# Patient Record
Sex: Female | Born: 1955 | Race: White | Hispanic: No | Marital: Married | Smoking: Former smoker
Health system: Southern US, Community
[De-identification: ages and names within clinical notes are randomized; demographics above are authoritative.]

## PROBLEM LIST (undated history)

## (undated) DIAGNOSIS — R079 Chest pain, unspecified: Secondary | ICD-10-CM

## (undated) DIAGNOSIS — Z8249 Family history of ischemic heart disease and other diseases of the circulatory system: Secondary | ICD-10-CM

## (undated) DIAGNOSIS — I10 Essential (primary) hypertension: Secondary | ICD-10-CM

## (undated) DIAGNOSIS — E669 Obesity, unspecified: Secondary | ICD-10-CM

## (undated) DIAGNOSIS — E785 Hyperlipidemia, unspecified: Secondary | ICD-10-CM

## (undated) HISTORY — DX: Hyperlipidemia, unspecified: E78.5

## (undated) HISTORY — PX: WISDOM TOOTH EXTRACTION: SHX21

## (undated) HISTORY — PX: OTHER SURGICAL HISTORY: SHX169

## (undated) HISTORY — DX: Family history of ischemic heart disease and other diseases of the circulatory system: Z82.49

## (undated) HISTORY — DX: Obesity, unspecified: E66.9

## (undated) HISTORY — DX: Essential (primary) hypertension: I10

## (undated) HISTORY — DX: Chest pain, unspecified: R07.9

---

## 1999-09-30 ENCOUNTER — Ambulatory Visit (HOSPITAL_COMMUNITY): Admission: RE | Admit: 1999-09-30 | Discharge: 1999-09-30 | Payer: Self-pay | Admitting: Family Medicine

## 1999-09-30 ENCOUNTER — Encounter: Payer: Self-pay | Admitting: Family Medicine

## 2004-12-22 ENCOUNTER — Emergency Department (HOSPITAL_COMMUNITY): Admission: EM | Admit: 2004-12-22 | Discharge: 2004-12-23 | Payer: Self-pay | Admitting: Emergency Medicine

## 2011-12-04 ENCOUNTER — Ambulatory Visit (INDEPENDENT_AMBULATORY_CARE_PROVIDER_SITE_OTHER): Payer: Self-pay | Admitting: Obstetrics and Gynecology

## 2011-12-04 ENCOUNTER — Encounter: Payer: Self-pay | Admitting: Obstetrics and Gynecology

## 2011-12-04 VITALS — BP 118/78 | Ht 62.0 in | Wt 162.0 lb

## 2011-12-04 DIAGNOSIS — N951 Menopausal and female climacteric states: Secondary | ICD-10-CM

## 2011-12-04 LAB — T4, FREE: Free T4: 0.92 ng/dL (ref 0.80–1.80)

## 2011-12-04 LAB — ESTRADIOL: Estradiol: 19.2 pg/mL

## 2011-12-04 LAB — TSH: TSH: 1.785 u[IU]/mL (ref 0.350–4.500)

## 2011-12-04 NOTE — Patient Instructions (Signed)

## 2011-12-04 NOTE — Progress Notes (Signed)
Irreg Periods: no Mood Swings: yes Hot Flashes: yes Vaginal Dryness: yes Poor Sleeping: yes Urinary Urgency: no UTI Symptoms: no HRT: no Fam Hx Osteo: no Prior Bone Scan: No Osteoporosis: No Hx of Dvt: No  Pt here today to get hormone levels check pt post menopausal  Pt decline annual today only wants to discus menopausal symptoms Physical Examination: General appearance - alert, well appearing, and in no distress Mental status - alert, oriented to person, place, and time Neck - supple, no significant adenopathy Chest - clear to auscultation, no wheezes, rales or rhonchi, symmetric air entry, no tachypnea, retractions or cyanosis Heart - normal rate and regular rhythm Abdomen - soft, nontender, nondistended, no masses or organomegaly Breasts - breasts appear normal, no suspicious masses, no skin or nipple changes or axillary nodes, patient declines to have breast exam Pelvic - normal external genitalia, vulva, vagina, cervix, uterus and adnexa, exam declined by the patient Rectal - normal rectal, no masses, declined by the patient Back exam - full range of motion, no tenderness, palpable spasm or pain on motion Musculoskeletal - no joint tenderness, deformity or swelling Extremities - peripheral pulses normal, no pedal edema, no clubbing or cyanosis Skin - normal coloration and turgor, no rashes, no suspicious skin lesions noted Menopausal symptoms Pt declined pap and pelvic exam Check testosterone, estrogen and progesterone. Will determine if she needs HRT and F/U with EP Pt will do guiac cards She declined colonoscopy

## 2011-12-07 ENCOUNTER — Telehealth: Payer: Self-pay | Admitting: Obstetrics and Gynecology

## 2011-12-07 DIAGNOSIS — N951 Menopausal and female climacteric states: Secondary | ICD-10-CM | POA: Insufficient documentation

## 2011-12-07 LAB — TESTOSTERONE, FREE, TOTAL, SHBG: Sex Hormone Binding: 38 nmol/L (ref 18–114)

## 2011-12-07 MED ORDER — ESTRADIOL 0.0375 MG/24HR TD PTTW: 1.0000 | MEDICATED_PATCH | TRANSDERMAL | Status: DC

## 2011-12-07 MED ORDER — PROGESTERONE MICRONIZED 200 MG PO CAPS: 200.0000 mg | ORAL_CAPSULE | Freq: Every day | ORAL | Status: DC

## 2011-12-07 NOTE — Telephone Encounter (Signed)
Call to patient at Dr. Redmond Baseman request to review hormone labs and management.  Patient became menopausal just over a year ago and developed moodiness, brittle hair, decreased energy, occasional depression, insomnia and decreased libido.  She admits to personal challenges in that her 57 YO daughter was diagnosed and treated for cancer that required a hysterectomy.  Patient's testosterone and TSH were normal and estradiol low normal and progesterone undetectable.  Patient states that she wants to try some hormones  Adds that her daughter takes progesterone drops.  Reviewed causes of decreased libido in women to include her recent family crisis.  To place patient on Vivelle-Dot (Minivelle) 0.0375 mg Patch twice weekly and Micronized Progesterone 200mg  qhs.   Patient to follow up in 4-6 weeks with a report of how her symptoms are and the possible need to adjust her medication.  Urged to call sooner if needed.  Savayah Waltrip J. Lowell Guitar, PA-C

## 2011-12-10 ENCOUNTER — Telehealth: Payer: Self-pay | Admitting: Obstetrics and Gynecology

## 2011-12-11 NOTE — Telephone Encounter (Signed)
Tc to pt per telephone call. Pt states,"took patch off due to feeling of bp being increased". Pt states," started to have severe ha's and felt like the top of her head was going to come off". Pt wants to know of any additional rec for meds. Will consult with ep and cb with recs. Pt voices understanding.

## 2011-12-22 ENCOUNTER — Telehealth: Payer: Self-pay | Admitting: Obstetrics and Gynecology

## 2011-12-22 NOTE — Telephone Encounter (Signed)
Call to patient who left message about having an increase in Bp  3 hours after starting Vivelle-Dot Patch 0.0375.  Left message that  a return call will be made.  Henreitta Leber, PA-C

## 2011-12-22 NOTE — Telephone Encounter (Signed)
56 YO returns call stating that she had a headache with increased Bp when she used the [employer] so she stopped it.  Only used the Progesterone for 3 days and stopped it because she wasn't sure whether to use it.  Advised to resume the progesterone and to call in 2 weeks to  give up date on results.  Patient was agreeable.  Henreitta Leber, PA-C

## 2014-04-25 ENCOUNTER — Ambulatory Visit: Payer: BC Managed Care – PPO | Admitting: Obstetrics & Gynecology

## 2014-05-21 ENCOUNTER — Ambulatory Visit (INDEPENDENT_AMBULATORY_CARE_PROVIDER_SITE_OTHER): Payer: BC Managed Care – PPO | Admitting: Obstetrics

## 2014-05-21 ENCOUNTER — Encounter: Payer: Self-pay | Admitting: Obstetrics & Gynecology

## 2014-05-21 ENCOUNTER — Ambulatory Visit: Payer: BC Managed Care – PPO | Admitting: Obstetrics & Gynecology

## 2014-05-21 VITALS — BP 151/92 | HR 66 | Temp 98.5°F | Wt 173.0 lb

## 2014-05-21 DIAGNOSIS — N842 Polyp of vagina: Secondary | ICD-10-CM | POA: Insufficient documentation

## 2014-05-21 DIAGNOSIS — N898 Other specified noninflammatory disorders of vagina: Secondary | ICD-10-CM

## 2014-05-21 LAB — WET PREP BY MOLECULAR PROBE
CANDIDA SPECIES: NEGATIVE
GARDNERELLA VAGINALIS: NEGATIVE
Trichomonas vaginosis: NEGATIVE

## 2014-05-21 NOTE — Progress Notes (Signed)
Patient ID: [name], female   DOB: 05-04-1956, 58 y.o.   MRN: [id number]  Chief Complaint  Patient presents with  . Problem    HPI Christy Wong is a 58 y.o. female.  Referred by PCP for vaginal polyp.  No complaints.  Menopausal.  HPI  History reviewed. No pertinent past medical history.  Past Surgical History  Procedure Laterality Date  . Tummy tuck    . Wisdom tooth extraction      Family History  Problem Relation Age of Onset  . Heart disease Father   . Uterine cancer Daughter   . Hypertension Mother     Social History History  Substance Use Topics  . Smoking status: Never Smoker   . Smokeless tobacco: Not on file  . Alcohol Use: No    No Known Allergies  Current Outpatient Prescriptions  Medication Sig Dispense Refill  . estradiol (MINIVELLE) 0.0375 MG/24HR Place 1 patch onto the skin 2 (two) times a week.  8 patch  12  . Multiple Vitamins-Minerals (MULTIVITAMIN WITH MINERALS) tablet Take 1 tablet by mouth daily.      . progesterone (PROMETRIUM) 200 MG capsule Take 1 capsule (200 mg total) by mouth daily. bedtime  30 capsule  11   No current facility-administered medications for this visit.    Review of Systems Review of Systems Constitutional: negative for fatigue and weight loss Respiratory: negative for cough and wheezing Cardiovascular: negative for chest pain, fatigue and palpitations Gastrointestinal: negative for abdominal pain and change in bowel habits Genitourinary: vaginal polyp Integument/breast: negative for nipple discharge Musculoskeletal:negative for myalgias Neurological: negative for gait problems and tremors Behavioral/Psych: negative for abusive relationship, depression Endocrine: negative for temperature intolerance     Blood pressure 151/92, pulse 66, temperature 98.5 F (36.9 C), weight 173 lb (78.472 kg).  Physical Exam Physical Exam General:   alert  Skin:   no rash or abnormalities  Lungs:   clear to auscultation  bilaterally  Heart:   regular rate and rhythm, S1, S2 normal, no murmur, click, rub or Lipke  Breasts:   normal without suspicious masses, skin or nipple changes or axillary nodes  Abdomen:  normal findings: no organomegaly, soft, non-tender and no hernia  Pelvis:  External genitalia: normal general appearance Urinary system: urethral meatus normal and bladder without fullness, nontender Vaginal: small healthy appearing polyp ~ 1cm left vaginal wall. Cervix: normal appearance Adnexa: normal bimanual exam Uterus: anteverted and non-tender, normal size      Data Reviewed labs  Assessment    Vaginal polyp.  Benign appearing. Menopausal, no complaints.     Plan   Discussed findings on exam.  Patient would not like any intervention if poly appears benign.  Will follow.  Mammogram recommended.  Orders Placed This Encounter  Procedures  . WET PREP BY MOLECULAR PROBE   No orders of the defined types were placed in this encounter.        HARPER,CHARLES A 05/21/2014, 6:04 PM

## 2014-06-04 ENCOUNTER — Encounter: Payer: Self-pay | Admitting: Obstetrics & Gynecology

## 2014-06-21 ENCOUNTER — Telehealth: Payer: Self-pay | Admitting: Cardiovascular Disease

## 2014-06-21 NOTE — Telephone Encounter (Signed)
Received records from [place] ( Dr Precious Haws) for appointment with Dr Gwenlyn Found on 07/04/14.  Records given to [facility] (medical records) for Dr Kennon Holter schedule on 07/04/14.  lp

## 2014-07-04 ENCOUNTER — Encounter: Payer: Self-pay | Admitting: Cardiovascular Disease

## 2014-07-04 ENCOUNTER — Ambulatory Visit (INDEPENDENT_AMBULATORY_CARE_PROVIDER_SITE_OTHER): Payer: BC Managed Care – PPO | Admitting: Cardiovascular Disease

## 2014-07-04 VITALS — BP 114/82 | HR 91 | Ht 62.0 in | Wt 173.0 lb

## 2014-07-04 DIAGNOSIS — E785 Hyperlipidemia, unspecified: Secondary | ICD-10-CM | POA: Insufficient documentation

## 2014-07-04 DIAGNOSIS — R079 Chest pain, unspecified: Secondary | ICD-10-CM | POA: Insufficient documentation

## 2014-07-04 DIAGNOSIS — I10 Essential (primary) hypertension: Secondary | ICD-10-CM

## 2014-07-04 DIAGNOSIS — I209 Angina pectoris, unspecified: Secondary | ICD-10-CM

## 2014-07-04 NOTE — Assessment & Plan Note (Signed)
Patient had a recent episode of left jaw and upper extremity pain with some fullness in her chest. She had minor episodes prior to that. Her risk factors include premature family history of heart disease, hypertension and hyperlipidemia. She really does not exercise. I'm going to get a pharmacologic Myoview stress test to rule out an ischemic etiology.

## 2014-07-04 NOTE — Assessment & Plan Note (Signed)
Recent onset hypertension with blood pressure today measured at 114/82 on Benicar 20 mg a day which was recently started. Continue current medication at current dose

## 2014-07-04 NOTE — Progress Notes (Signed)
07/04/2014 Christy Wong   1955/12/18  [id number]  Primary Physician Christy Bill, MD Primary Cardiologist: Christy Harp MD Christy Wong   HPI:  Christy Wong is a 58 year old moderately overweight married Caucasian female mother of 2, grandmother of one grandchild referred by Christy Wong in [place] for evaluation of coronary disease. Her cardiac risk factor profile is notable for premature family history of heart disease with a father who had his first myocardial infarction at age 35 and died 106 years later. She has a history of treated hypertension and untreated hyperlipidemia. She works as a Forensic psychologist. 2 weeks ago she had an episode of left jaw pain and left upper extremity pain and some chest fullness. She had some subtle symptoms prior to that And none since.   Current Outpatient Prescriptions  Medication Sig Dispense Refill  . acyclovir (ZOVIRAX) 400 MG tablet Take by mouth as needed. Prn as needed for fever blisters    . aspirin 81 MG tablet Take 81 mg by mouth 2 (two) times a week.    . Bromelains (BROMELAIN PO) Take 1 tablet by mouth daily.    Marland Kitchen dexlansoprazole (DEXILANT) 60 MG capsule Take 60 mg by mouth as needed.    . fluticasone (FLONASE) 50 MCG/ACT nasal spray as needed.    . Multiple Vitamins-Minerals (MULTIVITAMIN WITH MINERALS) tablet Take 1 tablet by mouth daily.    Marland Kitchen olmesartan (BENICAR) 20 MG tablet Take 20 mg by mouth daily.    . Omega-3 Fatty Acids (FISH OIL CONCENTRATE PO) Take by mouth.     No current facility-administered medications for this visit.    No Known Allergies  History   Social History  . Marital Status: Married    Spouse Name: N/A    Number of Children: N/A  . Years of Education: N/A   Occupational History  . Not on file.   Social History Main Topics  . Smoking status: Former Research scientist (life sciences)  . Smokeless tobacco: Not on file     Comment: smoked for a short period in the 70s and also used the nicotine vapor  in 2014  . Alcohol Use: No  . Drug Use: Not on file  . Sexual Activity: Yes    Birth Control/ Protection: Post-menopausal   Other Topics Concern  . Not on file   Social History Narrative     Review of Systems: General: negative for chills, fever, night sweats or weight changes.  Cardiovascular: negative for chest pain, dyspnea on exertion, edema, orthopnea, palpitations, paroxysmal nocturnal dyspnea or shortness of breath Dermatological: negative for rash Respiratory: negative for cough or wheezing Urologic: negative for hematuria Abdominal: negative for nausea, vomiting, diarrhea, bright red blood per rectum, melena, or hematemesis Neurologic: negative for visual changes, syncope, or dizziness All other systems reviewed and are otherwise negative except as noted above.    Blood pressure 114/82, pulse 91, height 5\' 2"  (1.575 m), weight 173 lb (78.472 kg).  General appearance: alert and no distress Neck: no adenopathy, no carotid bruit, no JVD, supple, symmetrical, trachea midline and thyroid not enlarged, symmetric, no tenderness/mass/nodules Lungs: clear to auscultation bilaterally Heart: regular rate and rhythm, S1, S2 normal, no murmur, click, rub or Teicher Extremities: extremities normal, atraumatic, no cyanosis or edema  EKG normal sinus rhythm at 91 with nonspecific ST and T-wave changes. I personally reviewed this EKG  ASSESSMENT AND PLAN:   Essential hypertension Recent onset hypertension with blood pressure today measured at 114/82 on  Benicar 20 mg a day which was recently started. Continue current medication at current dose  Chest pain Patient had a recent episode of left jaw and upper extremity pain with some fullness in her chest. She had minor episodes prior to that. Her risk factors include premature family history of heart disease, hypertension and hyperlipidemia. She really does not exercise. I'm going to get a pharmacologic Myoview stress test to rule out an  ischemic etiology.  Hyperlipidemia History of hyperlipidemia currently not on statin drugs. We will obtain her most recent blood work and evaluate      Christy Harp MD [place], [facility] 07/04/2014 12:28 PM

## 2014-07-04 NOTE — Patient Instructions (Signed)
Lexiscan Myoview- this is a test that looks at the blood flow to your heart muscle.  It takes approximately 2 1/2 hours. Please follow instruction sheet, as given.   Dr. Gwenlyn Found would like you to follow up with him following your diagnostic exam.

## 2014-07-04 NOTE — Assessment & Plan Note (Signed)
History of hyperlipidemia currently not on statin drugs. We will obtain her most recent blood work and evaluate

## 2014-07-06 ENCOUNTER — Telehealth (HOSPITAL_COMMUNITY): Payer: Self-pay

## 2014-07-06 NOTE — Telephone Encounter (Signed)
Encounter complete. 

## 2014-07-11 ENCOUNTER — Ambulatory Visit (HOSPITAL_COMMUNITY)
Admission: RE | Admit: 2014-07-11 | Discharge: 2014-07-11 | Disposition: A | Discharge status: Home / Self Care | Payer: BC Managed Care – PPO | Source: Ambulatory Visit | Attending: Cardiovascular Disease | Admitting: Cardiovascular Disease

## 2014-07-11 DIAGNOSIS — I209 Angina pectoris, unspecified: Secondary | ICD-10-CM | POA: Diagnosis present

## 2014-07-11 MED ORDER — AMINOPHYLLINE 25 MG/ML IV SOLN
75.0000 mg | Freq: Once | INTRAVENOUS | Status: AC
  Administered 2014-07-11: 75 mg via INTRAVENOUS

## 2014-07-11 MED ORDER — REGADENOSON 0.4 MG/5ML IV SOLN
0.4000 mg | Freq: Once | INTRAVENOUS | Status: AC
  Administered 2014-07-11: 0.4 mg via INTRAVENOUS

## 2014-07-11 MED ORDER — TECHNETIUM TC 99M SESTAMIBI GENERIC - CARDIOLITE
10.8000 | Freq: Once | INTRAVENOUS | Status: AC | PRN
  Administered 2014-07-11: 11 via INTRAVENOUS

## 2014-07-11 MED ORDER — TECHNETIUM TC 99M SESTAMIBI GENERIC - CARDIOLITE
29.2000 | Freq: Once | INTRAVENOUS | Status: AC | PRN
  Administered 2014-07-11: 29.2 via INTRAVENOUS

## 2014-07-11 NOTE — Procedures (Addendum)
[place] [address] [place] [address] [phone]  Cardiology Nuclear Med Study  Christy Wong is a 58 y.o. female     MRN : [id number]     DOB: 16-Nov-1955  Procedure Date: 07/11/2014  Nuclear Med Background Indication for Stress Test:  Evaluation for Ischemia History:  No prior cardiac or respiratory history reported. No prior NUC MPI for comparison. Cardiac Risk Factors: Family History - CAD, History of Smoking, Hypertension, Lipids and Obesity  Symptoms:  Chest Pain, DOE, Fatigue, SOB and Jaw pain and upper extremity pain.   Nuclear Pre-Procedure Caffeine/Decaff Intake:  1:00am NPO After: 11am   IV Site: R Forearm  IV 0.9% NS with Angio Cath:  22g  Chest Size (in):  n/a IV Started by: Rolene Course, RN  Height: 5\' 2"  (1.575 m)  Cup Size: D  BMI:  Body mass index is 31.63 kg/(m^2). Weight:  173 lb (78.472 kg)   Tech Comments:  n/a    Nuclear Med Study 1 or 2 day study: 1 day  Stress Test Type:  [place] Provider:  Quay Burow, MD   Resting Radionuclide: Technetium 49m Sestamibi  Resting Radionuclide Dose: 10.8 mCi   Stress Radionuclide:  Technetium 29m Sestamibi  Stress Radionuclide Dose: 29.2 mCi           Stress Protocol Rest HR: 71 Stress HR: 136  Rest BP: 108/74 Stress BP:138/85  Exercise Time (min): n/a METS: n/a          Dose of Adenosine (mg):  n/a Dose of Lexiscan: 0.4 mg  Dose of Atropine (mg): n/a Dose of Dobutamine: n/a mcg/kg/min (at max HR)  Stress Test Technologist: Mellody Memos, CCT Nuclear Technologist: Imagene Riches, CNMT   Rest Procedure:  Myocardial perfusion imaging was performed at rest 45 minutes following the intravenous administration of Technetium 85m Sestamibi. Stress Procedure:  The patient received IV Lexiscan 0.4 mg over 15-seconds.  Technetium 88m Sestamibi injected at 30-seconds.  Patient experienced shortness of breath, dizziness, chest  tightness and a tingling sensation all over.  She was administered 75 mg of Aminophylline IV . There were no significant changes with Lexiscan.  Quantitative spect images were obtained after a 45 minute delay.  Transient Ischemic Dilatation (Normal <1.22):  1.26  QGS EDV:  57 ml QGS ESV:  18 ml LV Ejection Fraction: 68%   Rest ECG: NSR - Normal EKG  Stress ECG: No significant change from baseline ECG  QPS Raw Data Images:  Normal; no motion artifact; normal heart/lung ratio. Stress Images:  Normal homogeneous uptake in all areas of the myocardium. Rest Images:  Normal homogeneous uptake in all areas of the myocardium. Subtraction (SDS):  No evidence of ischemia. LV Wall Motion:  NL LV Function; NL Wall Motion  Impression Exercise Capacity:  Lexiscan with no exercise. BP Response:  Normal blood pressure response. Clinical Symptoms:  No significant symptoms noted. ECG Impression:  No significant ECG changes with Lexiscan. Comparison with Prior Nuclear Study: No previous nuclear study performed   Overall Impression:  Normal stress nuclear study.   Sanda Klein, MD  07/11/2014 5:01 PM

## 2014-07-13 ENCOUNTER — Encounter: Payer: Self-pay | Admitting: Cardiovascular Disease

## 2014-07-30 ENCOUNTER — Encounter: Payer: Self-pay | Admitting: *Deleted

## 2014-07-31 ENCOUNTER — Encounter: Payer: Self-pay | Admitting: Obstetrics & Gynecology

## 2014-07-31 ENCOUNTER — Encounter: Payer: Self-pay | Admitting: Cardiovascular Disease

## 2014-07-31 ENCOUNTER — Ambulatory Visit (INDEPENDENT_AMBULATORY_CARE_PROVIDER_SITE_OTHER): Payer: BC Managed Care – PPO | Admitting: Cardiovascular Disease

## 2014-07-31 VITALS — BP 118/80 | HR 66 | Ht 62.0 in | Wt 175.8 lb

## 2014-07-31 DIAGNOSIS — I10 Essential (primary) hypertension: Secondary | ICD-10-CM

## 2014-07-31 DIAGNOSIS — E785 Hyperlipidemia, unspecified: Secondary | ICD-10-CM

## 2014-07-31 DIAGNOSIS — I209 Angina pectoris, unspecified: Secondary | ICD-10-CM

## 2014-07-31 NOTE — Patient Instructions (Signed)
Your physician wants you to follow-up in 6 months with Dr. Gwenlyn Found. You will receive a reminder letter in the mail 2 months in advance. If you do not receive a letter, please call our office to schedule the follow-up appointment.  *NO CHARGE FOR OFFICE VISIT-PER DR. Gwenlyn Found*

## 2014-07-31 NOTE — Assessment & Plan Note (Signed)
Blood pressure today was 118/80 on Benicar 20 mg a day. Continue current meds at current dosing

## 2014-07-31 NOTE — Progress Notes (Signed)
Mrs. Christy Wong returns for follow-up of her Myoview stress test performed on 07/11/14 to evaluate jaw or arm and chest pain. This was entirely normal. She has had some atypical left upper extremity pain which does not sound ischemic. Her blood pressure is under better control and her hyperlipidemia is being treated by her PCP. I will see her back in 6 months for follow-up.  Lorretta Harp, M.D., [place], [facility], [place] [address] [place], [place]  [address]  [phone] 07/31/2014 9:08 AM

## 2014-07-31 NOTE — Assessment & Plan Note (Signed)
No further jaw or chest pain. The patient had a negative Myoview stress test on 07/11/14. I reassured her and we'll see her back in 6 months

## 2014-07-31 NOTE — Assessment & Plan Note (Signed)
History of hyperlipidemia on red yeast rice followed by her PCP

## 2014-12-07 ENCOUNTER — Other Ambulatory Visit: Payer: Self-pay | Admitting: Family Medicine

## 2015-07-01 ENCOUNTER — Other Ambulatory Visit (HOSPITAL_COMMUNITY)
Admission: RE | Admit: 2015-07-01 | Discharge: 2015-07-01 | Disposition: A | Discharge status: Home / Self Care | Payer: 59 | Source: Ambulatory Visit | Attending: Obstetrics and Gynecology | Admitting: Obstetrics and Gynecology

## 2015-07-01 ENCOUNTER — Other Ambulatory Visit: Payer: Self-pay | Admitting: Obstetrics and Gynecology

## 2015-07-01 DIAGNOSIS — Z01419 Encounter for gynecological examination (general) (routine) without abnormal findings: Secondary | ICD-10-CM | POA: Insufficient documentation

## 2015-07-01 DIAGNOSIS — Z1151 Encounter for screening for human papillomavirus (HPV): Secondary | ICD-10-CM | POA: Diagnosis not present

## 2015-07-02 LAB — CYTOLOGY - PAP

## 2015-07-05 ENCOUNTER — Other Ambulatory Visit: Payer: Self-pay | Admitting: Obstetrics and Gynecology

## 2015-07-25 ENCOUNTER — Encounter (HOSPITAL_COMMUNITY)
Admission: RE | Admit: 2015-07-25 | Discharge: 2015-07-25 | Disposition: A | Discharge status: Home / Self Care | Payer: 59 | Source: Ambulatory Visit | Attending: Obstetrics and Gynecology | Admitting: Obstetrics and Gynecology

## 2015-07-25 ENCOUNTER — Other Ambulatory Visit: Payer: Self-pay

## 2015-07-25 ENCOUNTER — Encounter (HOSPITAL_COMMUNITY): Payer: Self-pay

## 2015-07-25 DIAGNOSIS — Z01818 Encounter for other preprocedural examination: Secondary | ICD-10-CM | POA: Diagnosis not present

## 2015-07-25 DIAGNOSIS — N950 Postmenopausal bleeding: Secondary | ICD-10-CM | POA: Insufficient documentation

## 2015-07-25 LAB — CBC
HCT: 40.5 % (ref 36.0–46.0)
Hemoglobin: 13.1 g/dL (ref 12.0–15.0)
MCH: 29.5 pg (ref 26.0–34.0)
MCHC: 32.3 g/dL (ref 30.0–36.0)
MCV: 91.2 fL (ref 78.0–100.0)
Platelets: 337 10*3/uL (ref 150–400)
RBC: 4.44 MIL/uL (ref 3.87–5.11)
RDW: 14 % (ref 11.5–15.5)
WBC: 8.4 10*3/uL (ref 4.0–10.5)

## 2015-07-25 LAB — BASIC METABOLIC PANEL
Anion gap: 7 (ref 5–15)
BUN: 13 mg/dL (ref 6–20)
CO2: 27 mmol/L (ref 22–32)
Calcium: 9.3 mg/dL (ref 8.9–10.3)
Chloride: 100 mmol/L — ABNORMAL LOW (ref 101–111)
Creatinine, Ser: 0.7 mg/dL (ref 0.44–1.00)
GFR calc Af Amer: >60 mL/min (ref >60)
GFR calc non Af Amer: >60 mL/min (ref >60)
GLUCOSE: 120 mg/dL — AB (ref 65–99)
POTASSIUM: 4.1 mmol/L (ref 3.5–5.1)
Sodium: 134 mmol/L — ABNORMAL LOW (ref 135–145)

## 2015-07-25 NOTE — Patient Instructions (Addendum)
Your procedure is scheduled on:07/30/15  Enter through the Main Entrance at :9am  Pick up desk phone and dial [phone] and inform us of your arrival.  Please call [phone] if you have any problems the morning of surgery.  Remember: Do not eat food or drink liquids, including water, after midnight:Monday   You may brush your teeth the morning of surgery.  Take these meds the morning of surgery with a sip of water: Benicar  DO NOT wear jewelry, eye make-up, lipstick,body lotion, or dark fingernail polish.  (Polished toes are ok) You may wear deodorant.  If you are to be admitted after surgery, leave suitcase in car until your room has been assigned. Patients discharged on the day of surgery will not be allowed to drive home. Wear loose fitting, comfortable clothes for your ride home.

## 2015-07-26 NOTE — Anesthesia Preprocedure Evaluation (Addendum)
Anesthesia Evaluation  Patient identified by MRN, date of birth, ID band Patient awake    Reviewed: Allergy & Precautions, NPO status , Patient's Chart, lab work & pertinent test results  Airway Mallampati: II   Neck ROM: Full    Dental  (+) Teeth Intact, Dental Advisory Given   Pulmonary neg pulmonary ROS, former smoker (quit 1998),    breath sounds clear to auscultation       Cardiovascular hypertension, Pt. on medications  Rhythm:Regular  STRESS 07/2014 Normal EF 68%   Neuro/Psych negative neurological ROS  negative psych ROS   GI/Hepatic negative GI ROS, Neg liver ROS,   Endo/Other  negative endocrine ROS  Renal/GU negative Renal ROS   Post menopausal bleeding   negative genitourinary   Musculoskeletal negative musculoskeletal ROS (+)   Abdominal (+)  Abdomen: soft.    Peds negative pediatric ROS (+)  Hematology negative hematology ROS (+) 13/40   Anesthesia Other Findings   Reproductive/Obstetrics negative OB ROS                            Anesthesia Physical Anesthesia Plan  ASA: II  Anesthesia Plan: General   Post-op Pain Management:    Induction:   Airway Management Planned: LMA  Additional Equipment:   Intra-op Plan:   Post-operative Plan: Extubation in OR  Informed Consent: I have reviewed the patients History and Physical, chart, labs and discussed the procedure including the risks, benefits and alternatives for the proposed anesthesia with the patient or authorized representative who has indicated his/her understanding and acceptance.     Plan Discussed with:   Anesthesia Plan Comments:         Anesthesia Quick Evaluation
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
## 2015-07-29 NOTE — H&P (Signed)
History of Present Illness  General:  Pt presents for EMB due to PMB. Failed attempt due to cervical stenosis. Pt s/p Cytotec. Pt has an endometrial mass vs. submucosal fibroid on ultrasound. Mass measures ~1.5 cm without blood flow in the fundus. Hysteroscopy/D&C with resection of mass if EMB is negative for carcinoma. Pt agreeable.   Current Medications  Taking   Vitamin B12 3000 MCG/ML Liquid   Benicar(Olmesartan Medoxomil) 20 MG Tablet   Acyclovir 400 mg tablet 1 tab as needed four times a day   Cytotec(Misoprostol) 200 MCG Tablet 1 table Plce 1 tablet in vagina in the morning the day before procedure and then place 1 tablet night before procedure   Not-Taking/PRN   Flonase(Fluticasone Propionate) 50 MCG/ACT Suspension 1 spray in each nostril Once a day   Unknown   Zithromax Z-pak(Azithromycin) 250 MG Tablet 2 p.o. the 1st day, then 1 p.o. q.d. for 4 days    Past Medical History  Fever blisters  Vaginal polyp  Bunion, right foot  Bacteria vaginitis  Hyperlipidemia  Pterygium of left eye  Tobacco use  Allergic rhinitis   Surgical History  tummy tuck early 82s   Family History  Father: deceased 71 yrs, CAD, died of MI at 34, first MI at 27, h/o agent orange exposure  Mother: deceased 48 yrs, fibromalgia, back prolems, DM, HTN, diagnosed with Lung Ca  Paternal Grand Father: deceased  Paternal [place] Mother: deceased  Maternal Grand Father: deceased  Maternal Grand Mother: deceased  Brother 1: alive 26 yrs, healthy  1 brother(s) .   Denies family hx colon cancer, colon polyps or liver disease, Daughter had endometrial stroma carcinoma at 68.   Social History  General:  Tobacco use  cigarettes: Former smoker Quit in year 1980 Tobacco history last updated 07/05/2015 no EXPOSURE TO PASSIVE SMOKE.  Alcohol: yes, occ.  no Recreational drug use.  Exercise: minimal.  Marital Status: Married Doctor, hospital, Theme park manager at [employer].  Children: 2 daughters Olivia Mackie and  Erasmo Downer.  OCCUPATION: day trading and rental properties.  Religion: [facility].    Gyn History  Periods : Postmenopasaul at age 60 .  LMP spotting off and on x 6 months.  Denies H/O Birth control.  Last pap smear date 07/01/15, all negative.  Last mammogram date more than two years.  Denies H/O Abnormal pap smear.  Denies H/O STD.    OB History  Number of pregnancies 2.  Pregnancy # 1 live birth, vaginal delivery.  Pregnancy # 2 live birth, vaginal delivery.    Allergies  N.K.D.A.   Hospitalization/Major Diagnostic Procedure  tummy tuck    Vital Signs  Wt 178, Ht 61.75, BMI 32.82, Pulse sitting 54, BP sitting 120/74.   Physical Examination  GENERAL:  Patient appears alert and oriented.  General Appearance: well-appearing, well-developed, no acute distress.  Speech: clear.  FEMALE GENITOURINARY:  Cervix visualized, healthy appearing, no discharge, no lesions.  Vagina: atrophic mucosa, no lesions, no abnormal discharge.  Vulva: normal, no lesions, no skin discoloration.     Assessments   1. Postmenopausal bleeding - N95.0 (Primary)   2. Endometrial mass - N94.89   Treatment  1. Postmenopausal bleeding  Notes: Pt counseled on Hyst/D&C with resection of mass via Myosure to complete w/u of PMB. Refer to Gyn/Onc if carcinoma.    Procedures  Endometrial Biopsy:  Consent Informed consent obtained. UPT negative.  Prep cervix was prepped with betadine.  Procedure uterus was sounded to 5-6 cm. a 4 mm  pipet was advanced without difficulty, with good return of tissue, 2 passes.  Post procedure Patient tolerated procedure well.  Indication abnormal uterine bleeding.       Procedure Codes  [id number] ENDOMET BIOPSY OFFICE   Follow Up  prn pending results

## 2015-07-30 ENCOUNTER — Ambulatory Visit (HOSPITAL_COMMUNITY)
Admission: RE | Admit: 2015-07-30 | Discharge: 2015-07-30 | Disposition: A | Discharge status: Home / Self Care | Payer: 59 | Source: Ambulatory Visit | Attending: Obstetrics and Gynecology | Admitting: Obstetrics and Gynecology

## 2015-07-30 ENCOUNTER — Ambulatory Visit (HOSPITAL_COMMUNITY): Payer: 59 | Admitting: Anesthesiology

## 2015-07-30 ENCOUNTER — Encounter (HOSPITAL_COMMUNITY)
Admission: RE | Disposition: A | Discharge status: Home / Self Care | Payer: Self-pay | Source: Ambulatory Visit | Attending: Obstetrics and Gynecology

## 2015-07-30 ENCOUNTER — Encounter (HOSPITAL_COMMUNITY): Payer: Self-pay | Admitting: *Deleted

## 2015-07-30 DIAGNOSIS — Z87891 Personal history of nicotine dependence: Secondary | ICD-10-CM | POA: Insufficient documentation

## 2015-07-30 DIAGNOSIS — N950 Postmenopausal bleeding: Secondary | ICD-10-CM | POA: Diagnosis present

## 2015-07-30 DIAGNOSIS — M4802 Spinal stenosis, cervical region: Secondary | ICD-10-CM | POA: Diagnosis not present

## 2015-07-30 DIAGNOSIS — D250 Submucous leiomyoma of uterus: Secondary | ICD-10-CM | POA: Insufficient documentation

## 2015-07-30 DIAGNOSIS — N840 Polyp of corpus uteri: Secondary | ICD-10-CM | POA: Diagnosis not present

## 2015-07-30 HISTORY — PX: DILATATION & CURETTAGE/HYSTEROSCOPY WITH MYOSURE: SHX6511

## 2015-07-30 SURGERY — DILATATION & CURETTAGE/HYSTEROSCOPY WITH MYOSURE
Anesthesia: General | Site: Vagina

## 2015-07-30 MED ORDER — LIDOCAINE HCL (CARDIAC) 20 MG/ML IV SOLN
INTRAVENOUS | Status: AC
  Filled 2015-07-30: qty 5

## 2015-07-30 MED ORDER — LACTATED RINGERS IV SOLN
INTRAVENOUS | Status: DC
  Administered 2015-07-30: 11:00:00 via INTRAVENOUS
  Administered 2015-07-30: 75 mL/h via INTRAVENOUS

## 2015-07-30 MED ORDER — SODIUM CHLORIDE 0.9 % IR SOLN
Status: DC | PRN
  Administered 2015-07-30 (×2): 1000 mL
  Administered 2015-07-30: 2000 mL

## 2015-07-30 MED ORDER — PROMETHAZINE HCL 25 MG/ML IJ SOLN
6.2500 mg | INTRAMUSCULAR | Status: DC | PRN
Start: 2015-07-30 — End: 2015-07-30

## 2015-07-30 MED ORDER — FENTANYL CITRATE (PF) 100 MCG/2ML IJ SOLN
INTRAMUSCULAR | Status: AC
  Filled 2015-07-30: qty 2

## 2015-07-30 MED ORDER — DEXAMETHASONE SODIUM PHOSPHATE 10 MG/ML IJ SOLN
INTRAMUSCULAR | Status: AC
  Filled 2015-07-30: qty 1

## 2015-07-30 MED ORDER — ONDANSETRON HCL 4 MG/2ML IJ SOLN
INTRAMUSCULAR | Status: AC
  Filled 2015-07-30: qty 2

## 2015-07-30 MED ORDER — KETOROLAC TROMETHAMINE 30 MG/ML IJ SOLN
INTRAMUSCULAR | Status: DC | PRN
  Administered 2015-07-30: 30 mg via INTRAVENOUS

## 2015-07-30 MED ORDER — PROPOFOL 10 MG/ML IV BOLUS
INTRAVENOUS | Status: AC
  Filled 2015-07-30: qty 20

## 2015-07-30 MED ORDER — KETOROLAC TROMETHAMINE 30 MG/ML IJ SOLN
INTRAMUSCULAR | Status: AC
  Filled 2015-07-30: qty 1

## 2015-07-30 MED ORDER — MEPERIDINE HCL 25 MG/ML IJ SOLN: 6.2500 mg | INTRAMUSCULAR | Status: DC | PRN

## 2015-07-30 MED ORDER — MIDAZOLAM HCL 5 MG/5ML IJ SOLN
INTRAMUSCULAR | Status: DC | PRN
  Administered 2015-07-30: 2 mg via INTRAVENOUS

## 2015-07-30 MED ORDER — FENTANYL CITRATE (PF) 100 MCG/2ML IJ SOLN
INTRAMUSCULAR | Status: DC | PRN
  Administered 2015-07-30 (×2): 50 ug via INTRAVENOUS

## 2015-07-30 MED ORDER — MIDAZOLAM HCL 2 MG/2ML IJ SOLN
INTRAMUSCULAR | Status: AC
  Filled 2015-07-30: qty 2

## 2015-07-30 MED ORDER — FENTANYL CITRATE (PF) 100 MCG/2ML IJ SOLN: 25.0000 ug | INTRAMUSCULAR | Status: DC | PRN

## 2015-07-30 MED ORDER — LIDOCAINE HCL 2 % IJ SOLN
INTRAMUSCULAR | Status: AC
  Filled 2015-07-30: qty 20

## 2015-07-30 MED ORDER — LIDOCAINE HCL 2 % IJ SOLN
INTRAMUSCULAR | Status: DC | PRN
  Administered 2015-07-30: 10 mL

## 2015-07-30 MED ORDER — EPHEDRINE 5 MG/ML INJ
INTRAVENOUS | Status: AC
Start: 2015-07-30 — End: 2015-07-30
  Filled 2015-07-30: qty 10

## 2015-07-30 MED ORDER — EPHEDRINE SULFATE 50 MG/ML IJ SOLN
INTRAMUSCULAR | Status: DC | PRN
  Administered 2015-07-30 (×4): 5 mg via INTRAVENOUS

## 2015-07-30 MED ORDER — DEXAMETHASONE SODIUM PHOSPHATE 4 MG/ML IJ SOLN
INTRAMUSCULAR | Status: DC | PRN
  Administered 2015-07-30: 10 mg via INTRAVENOUS

## 2015-07-30 MED ORDER — LIDOCAINE HCL (CARDIAC) 20 MG/ML IV SOLN
INTRAVENOUS | Status: DC | PRN
  Administered 2015-07-30: 100 mg via INTRAVENOUS

## 2015-07-30 MED ORDER — OXYCODONE-ACETAMINOPHEN 5-325 MG PO TABS: 2.0000 | ORAL_TABLET | ORAL | Status: DC | PRN

## 2015-07-30 MED ORDER — PROPOFOL 10 MG/ML IV BOLUS
INTRAVENOUS | Status: DC | PRN
  Administered 2015-07-30: 200 mg via INTRAVENOUS

## 2015-07-30 MED ORDER — ONDANSETRON HCL 4 MG/2ML IJ SOLN
INTRAMUSCULAR | Status: DC | PRN
  Administered 2015-07-30: 4 mg via INTRAVENOUS

## 2015-07-30 MED ORDER — IBUPROFEN 600 MG PO TABS: 600.0000 mg | ORAL_TABLET | Freq: Four times a day (QID) | ORAL | Status: AC | PRN

## 2015-07-30 SURGICAL SUPPLY — 19 items
CANISTER SUCT 3000ML (MISCELLANEOUS) ×5 IMPLANT
CATH ROBINSON RED A/P 16FR (CATHETERS) ×3 IMPLANT
CLOTH BEACON ORANGE TIMEOUT ST (SAFETY) ×3 IMPLANT
CONTAINER PREFILL 10% NBF 60ML (FORM) ×6 IMPLANT
DEVICE MYOSURE LITE (MISCELLANEOUS) IMPLANT
DEVICE MYOSURE REACH (MISCELLANEOUS) ×2 IMPLANT
DILATOR CANAL MILEX (MISCELLANEOUS) IMPLANT
FILTER ARTHROSCOPY CONVERTOR (FILTER) ×3 IMPLANT
GLOVE BIO SURGEON STRL SZ7 (GLOVE) ×3 IMPLANT
GLOVE BIOGEL PI IND STRL 7.0 (GLOVE) ×2 IMPLANT
GLOVE BIOGEL PI INDICATOR 7.0 (GLOVE) ×4
GOWN STRL REUS W/TWL LRG LVL3 (GOWN DISPOSABLE) ×6 IMPLANT
PACK VAGINAL MINOR WOMEN LF (CUSTOM PROCEDURE TRAY) ×3 IMPLANT
PAD OB MATERNITY 4.3X12.25 (PERSONAL CARE ITEMS) ×3 IMPLANT
SEAL ROD LENS SCOPE MYOSURE (ABLATOR) ×3 IMPLANT
TOWEL OR 17X24 6PK STRL BLUE (TOWEL DISPOSABLE) ×6 IMPLANT
TUBING AQUILEX INFLOW (TUBING) ×3 IMPLANT
TUBING AQUILEX OUTFLOW (TUBING) ×3 IMPLANT
WATER STERILE IRR 1000ML POUR (IV SOLUTION) ×3 IMPLANT

## 2015-07-30 NOTE — Discharge Instructions (Addendum)
Dilation and Curettage or Vacuum Curettage, Care After Refer to this sheet in the next few weeks. These instructions provide you with information on caring for yourself after your procedure. Your health care provider may also give you more specific instructions. Your treatment has been planned according to current medical practices, but problems sometimes occur. Call your health care provider if you have any problems or questions after your procedure. WHAT TO EXPECT AFTER THE PROCEDURE After your procedure, it is typical to have light cramping and bleeding. This may last for 2 days to 2 weeks after the procedure. HOME CARE INSTRUCTIONS   Do not drive for 24 hours.  Wait 1 week before returning to strenuous activities.  Take your temperature 2 times a day for 4 days and write it down. Provide these temperatures to your health care provider if you develop a fever.  Avoid long periods of standing.  Avoid heavy lifting, pushing, or pulling. Do not lift anything heavier than 10 pounds (4.5 kg).  Limit stair climbing to once or twice a day.  Take rest periods often.  You may resume your usual diet.  Drink enough fluids to keep your urine clear or pale yellow.  Your usual bowel function should return. If you have constipation, you may:  Take a mild laxative with permission from your health care provider.  Add fruit and bran to your diet.  Drink more fluids.  Take showers instead of baths until your health care provider gives you permission to take baths.  Do not go swimming or use a hot tub until your health care provider approves.  Try to have someone with you or available to you the first 24-48 hours, especially if you were given a general anesthetic.  Do not douche, use tampons, or have sex (intercourse) for 2 weeks after the procedure.  Only take over-the-counter or prescription medicines as directed by your health care provider. Do not take aspirin. It can cause  bleeding.  Follow up with your health care provider as directed. SEEK MEDICAL CARE IF:   You have increasing cramps or pain that is not relieved with medicine.  You have abdominal pain that does not seem to be related to the same area of earlier cramping and pain.  You have bad smelling vaginal discharge.  You have a rash.  You are having problems with any medicine. SEEK IMMEDIATE MEDICAL CARE IF:   You have bleeding that is heavier than a normal menstrual period.  You have a fever.  You have chest pain.  You have shortness of breath.  You feel dizzy or feel like fainting.  You pass out.  You have pain in your shoulder strap area.  You have heavy vaginal bleeding with or without blood clots. MAKE SURE YOU:   Understand these instructions.  Will watch your condition.  Will get help right away if you are not doing well or get worse.   This information is not intended to replace advice given to you by your health care provider. Make sure you discuss any questions you have with your health care provider.   Document Released: 07/17/2000 Document Revised: 07/25/2013 Document Reviewed: 02/16/2013 Elsevier Interactive Patient Education 2016 [place]: D&C / D&E The following instructions have been prepared to help you care for yourself upon your return home.   Personal hygiene:  Use sanitary pads for vaginal drainage, not tampons.  Shower the day after your procedure.  NO tub baths, pools or Jacuzzis for 2-3 weeks.  Wipe front to back after using the bathroom.  Activity and limitations:  Do NOT drive or operate any equipment for 24 hours. The effects of anesthesia are still present and drowsiness may result.  Do NOT rest in bed all day.  Walking is encouraged.  Walk up and down stairs slowly.  You may resume your normal activity in one to two days or as indicated by your physician.  Sexual activity: NO intercourse for at least 2  weeks after the procedure, or as indicated by your physician.  Diet: Eat a light meal as desired this evening. You may resume your usual diet tomorrow.  Return to work: You may resume your work activities in one to two days or as indicated by your doctor.  What to expect after your surgery: Expect to have vaginal bleeding/discharge for 2-3 days and spotting for up to 10 days. It is not unusual to have soreness for up to 1-2 weeks. You may have a slight burning sensation when you urinate for the first day. Mild cramps may continue for a couple of days. You may have a regular period in 2-6 weeks.  Call your doctor for any of the following:  Excessive vaginal bleeding, saturating and changing one pad every hour.  Inability to urinate 6 hours after discharge from hospital.  Pain not relieved by pain medication.  Fever of 100.4 F or greater.  Unusual vaginal discharge or odor.   Call for an appointment:    Patients signature: ______________________  Nurses signature ________________________  Support person's signature_______________________

## 2015-07-30 NOTE — Interval H&P Note (Signed)
History and Physical Interval Note:  07/30/2015 10:18 AM  Christy Wong  has presented today for surgery, with the diagnosis of  POST MENOPAUSAL BLEEDING  The various methods of treatment have been discussed with the patient and family. After consideration of risks, benefits and other options for treatment, the patient has consented to  Procedure(s) with comments: [place] (N/A) - Myosure for possible fibroid. as a surgical intervention .  The patient's history has been reviewed, patient examined, no change in status, stable for surgery.  I have reviewed the patient's chart and labs.  Questions were answered to the patient's satisfaction.     Simona Huh, Adalae Baysinger

## 2015-07-30 NOTE — Brief Op Note (Signed)
07/30/2015  11:29 AM  PATIENT:  Lezlie Octave Lona  59 y.o. female  PRE-OPERATIVE DIAGNOSIS:   POST MENOPAUSAL BLEEDING, endometrial mass  POST-OPERATIVE DIAGNOSIS:   Endometrial polyps, submucosal fibroid  PROCEDURE:  Procedure(s): DILATATION & CURETTAGE/HYSTEROSCOPY WITH MYOSURE (N/A)  Resection of fibroids and polyps  SURGEON:  Surgeon(s) and Role:    * Thurnell Lose, MD - Primary  PHYSICIAN ASSISTANT:   ASSISTANTS: none   ANESTHESIA:   general, paracervical block  EBL:  Total I/O In: 1000 [I.V.:1000] Out: 250 [Urine:250]  BLOOD ADMINISTERED:none  DRAINS: none   LOCAL MEDICATIONS USED:  2 % LIDOCAINE  and Amount: 10 ml  SPECIMEN:  Source of Specimen:  fibroids, polyps, endometrial currettings  DISPOSITION OF SPECIMEN:  PATHOLOGY  COUNTS:  YES  TOURNIQUET:  * No tourniquets in log *  DICTATION: .Other Dictation: Dictation Number [id number]  PLAN OF CARE: Discharge to home after PACU  PATIENT DISPOSITION:  PACU - hemodynamically stable.   Delay start of Pharmacological VTE agent (>24hrs) due to surgical blood loss or risk of bleeding: not applicable

## 2015-07-30 NOTE — Transfer of Care (Signed)
Immediate Anesthesia Transfer of Care Note  Patient: Christy Wong  Procedure(s) Performed: Procedure(s): DILATATION & CURETTAGE/HYSTEROSCOPY WITH MYOSURE (N/A)  Patient Location: PACU  Anesthesia Type:General  Level of Consciousness: awake, alert  and oriented  Airway & Oxygen Therapy: Patient Spontanous Breathing and Patient connected to nasal cannula oxygen  Post-op Assessment: Report given to RN and Post -op Vital signs reviewed and stable  Post vital signs: Reviewed and stable  Last Vitals:  Filed Vitals:   07/30/15 0918  BP: 127/86  Temp: 36.8 C  Resp: 16    Complications: No apparent anesthesia complications

## 2015-07-30 NOTE — Anesthesia Postprocedure Evaluation (Signed)
Anesthesia Post Note  Patient: Christy Wong  Procedure(s) Performed: Procedure(s) (LRB): DILATATION & CURETTAGE/HYSTEROSCOPY WITH MYOSURE (N/A)  Patient location during evaluation: PACU Anesthesia Type: General Level of consciousness: awake and alert Pain management: pain level controlled Vital Signs Assessment: post-procedure vital signs reviewed and stable Respiratory status: spontaneous breathing, nonlabored ventilation, respiratory function stable and patient connected to nasal cannula oxygen Cardiovascular status: blood pressure returned to baseline and stable Postop Assessment: no signs of nausea or vomiting Anesthetic complications: no    Last Vitals:  Filed Vitals:   07/30/15 1115 07/30/15 1130  BP: 128/82   Pulse: 101 98  Temp: 36.7 C   Resp: 12 17    Last Pain: There were no vitals filed for this visit.               Sharnee Douglass

## 2015-07-30 NOTE — Op Note (Signed)
Christy Wong, Christy Wong                ACCOUNT NO.:  [id number]  MEDICAL RECORD NO.:  [id number]  LOCATION:  [facility]                          FACILITY:  [facility]  PHYSICIAN:  Jola Schmidt, MD   DATE OF BIRTH:  1955-08-05  DATE OF PROCEDURE:  07/30/2015 DATE OF DISCHARGE:                              OPERATIVE REPORT   PREOPERATIVE DIAGNOSES: 1. Postmenopausal bleeding. 2. Endometrial mass.  POSTOPERATIVE DIAGNOSES: 1. Endometrial polyps. 2. Submucosal fibroid.  PROCEDURE:  Hysteroscopy, D and C, with resection of fibroids and polyps via MyoSure.  SURGEON:  Raul Del. Simona Huh, M.D.  ASSISTANT:  None.  ANESTHESIA:  General and paracervical block.  URINE OUTPUT:  250 mL out at the beginning of case.  EBL:  Minimal.  BLOOD ADMINISTERED:  None.  DRAINS:  None.  LOCAL:  2% lidocaine of 10 mL.  SPECIMEN:  Fibroid, polyp, and endometrial curettings.  Disposition of specimen to Pathology.  DISPOSITION:  To PACU.  Hemodynamically stable.  COMPLICATIONS:  None.  FINDINGS:  Three endometrial polyps and one submucosal fibroid.  Once the masses were resected, the endometrial cavity appeared to be normal.  PROCEDURE IN DETAIL:  [name] was taken to the operating room.  She was identified in the holding area.  She was consented for the procedure.  Risks and benefits were reviewed.  All questions are answered.  She was then taken to the operating room with IV running. She underwent general anesthesia without complication.  She was placed in the dorsal lithotomy position, prepped and draped.  A time-out was performed.  SCDs were on and operating prior to the procedure.  The patient did not received IV antibiotics.  The speculum was placed in the vagina.  Cervix was easily visualized.  A 2-3 mL of lidocaine was injected at the anterior lip of the cervix and the tenaculum was applied.  Paracervical block was performed.  The cervix was dilated up to a 6 dilator.  The hysteroscope was  then advanced to through the endocervix and one of the long endometrial polyps was hanging down into the cervix.  I was able to move around the polyps to see the fundus, but there is definitely a fibroid noted.  The MyoSure was then used to remove the polyps and at that point, I could see the fibroid very easily and that was easily resected as well without complication.  Endometrial cavity appeared normal and without any concerns for perforation.  The hysteroscope was removed, I did a sharp curettage of all 4 quadrants and the endometrial curettings were collected on the Telfa.  A single- tooth tenaculum was removed from the cervix.  There was no bleeding. Then, the speculum was removed from the vagina.  The patient did have some stooling at the last part of the procedure that did not interfere with any vaginal portion of the procedure.  All instruments, sponge and needle counts were correct x3.  The patient tolerated the procedure well.  She was taken to the recovery room in stable condition.  All fluid deficit from the hysteroscope was approximately 500.     Jola Schmidt, MD     EBV/MEDQ  D:  07/30/2015  T:  07/30/2015  Job:  [id number]

## 2015-07-30 NOTE — Anesthesia Procedure Notes (Signed)
Procedure Name: LMA Insertion Date/Time: 07/30/2015 10:33 AM Performed by: Riki Sheer Pre-anesthesia Checklist: Patient identified, Emergency Drugs available, Suction available, Patient being monitored and Timeout performed Patient Re-evaluated:Patient Re-evaluated prior to inductionOxygen Delivery Method: Circle system utilized Preoxygenation: Pre-oxygenation with 100% oxygen Intubation Type: IV induction Ventilation: Mask ventilation without difficulty LMA: LMA inserted LMA Size: 4.0 Number of attempts: 1 Placement Confirmation: positive ETCO2,  CO2 detector and breath sounds checked- equal and bilateral Tube secured with: Tape Dental Injury: Teeth and Oropharynx as per pre-operative assessment

## 2015-07-31 ENCOUNTER — Encounter (HOSPITAL_COMMUNITY): Payer: Self-pay | Admitting: Obstetrics and Gynecology

## 2020-10-02 ENCOUNTER — Emergency Department (HOSPITAL_BASED_OUTPATIENT_CLINIC_OR_DEPARTMENT_OTHER): Payer: Medicare Other

## 2020-10-02 ENCOUNTER — Other Ambulatory Visit: Payer: Self-pay

## 2020-10-02 ENCOUNTER — Emergency Department (HOSPITAL_BASED_OUTPATIENT_CLINIC_OR_DEPARTMENT_OTHER)
Admission: EM | Admit: 2020-10-02 | Discharge: 2020-10-02 | Disposition: A | Discharge status: Home / Self Care | Payer: Medicare Other | Attending: Emergency Medicine | Admitting: Emergency Medicine

## 2020-10-02 ENCOUNTER — Encounter (HOSPITAL_BASED_OUTPATIENT_CLINIC_OR_DEPARTMENT_OTHER): Payer: Self-pay | Admitting: Emergency Medicine

## 2020-10-02 DIAGNOSIS — Z87891 Personal history of nicotine dependence: Secondary | ICD-10-CM | POA: Insufficient documentation

## 2020-10-02 DIAGNOSIS — R109 Unspecified abdominal pain: Secondary | ICD-10-CM | POA: Diagnosis present

## 2020-10-02 DIAGNOSIS — I10 Essential (primary) hypertension: Secondary | ICD-10-CM | POA: Diagnosis not present

## 2020-10-02 DIAGNOSIS — K5792 Diverticulitis of intestine, part unspecified, without perforation or abscess without bleeding: Secondary | ICD-10-CM | POA: Insufficient documentation

## 2020-10-02 LAB — COMPREHENSIVE METABOLIC PANEL
ALT: 20 U/L (ref 0–44)
AST: 21 U/L (ref 15–41)
Albumin: 4.5 g/dL (ref 3.5–5.0)
Alkaline Phosphatase: 59 U/L (ref 38–126)
Anion gap: 12 (ref 5–15)
BUN: 21 mg/dL (ref 8–23)
CO2: 26 mmol/L (ref 22–32)
Calcium: 9.5 mg/dL (ref 8.9–10.3)
Chloride: 97 mmol/L — ABNORMAL LOW (ref 98–111)
Creatinine, Ser: 0.74 mg/dL (ref 0.44–1.00)
GFR, Estimated: >60 mL/min (ref >60)
Glucose, Bld: 109 mg/dL — ABNORMAL HIGH (ref 70–99)
Potassium: 3.7 mmol/L (ref 3.5–5.1)
Sodium: 135 mmol/L (ref 135–145)
Total Bilirubin: 0.4 mg/dL (ref 0.3–1.2)
Total Protein: 8.2 g/dL — ABNORMAL HIGH (ref 6.5–8.1)

## 2020-10-02 LAB — URINALYSIS, MICROSCOPIC (REFLEX): Bacteria, UA: NONE SEEN

## 2020-10-02 LAB — CBC
HCT: 40.7 % (ref 36.0–46.0)
Hemoglobin: 13.4 g/dL (ref 12.0–15.0)
MCH: 29.8 pg (ref 26.0–34.0)
MCHC: 32.9 g/dL (ref 30.0–36.0)
MCV: 90.4 fL (ref 80.0–100.0)
Platelets: 383 10*3/uL (ref 150–400)
RBC: 4.5 MIL/uL (ref 3.87–5.11)
RDW: 13.2 % (ref 11.5–15.5)
WBC: 14.8 10*3/uL — ABNORMAL HIGH (ref 4.0–10.5)
nRBC: 0 % (ref 0.0–0.2)

## 2020-10-02 LAB — LIPASE, BLOOD: Lipase: 36 U/L (ref 11–51)

## 2020-10-02 LAB — URINALYSIS, ROUTINE W REFLEX MICROSCOPIC
Bilirubin Urine: NEGATIVE
Glucose, UA: NEGATIVE mg/dL
Ketones, ur: 15 mg/dL — AB
Leukocytes,Ua: NEGATIVE
Nitrite: NEGATIVE
Protein, ur: NEGATIVE mg/dL
Specific Gravity, Urine: 1.025 (ref 1.005–1.030)
pH: 5 (ref 5.0–8.0)

## 2020-10-02 MED ORDER — IOHEXOL 300 MG/ML  SOLN
100.0000 mL | Freq: Once | INTRAMUSCULAR | Status: AC | PRN
  Administered 2020-10-02: 100 mL via INTRAVENOUS

## 2020-10-02 MED ORDER — ONDANSETRON HCL 4 MG/2ML IJ SOLN
4.0000 mg | Freq: Once | INTRAMUSCULAR | Status: AC
  Administered 2020-10-02: 4 mg via INTRAVENOUS
  Filled 2020-10-02: qty 2

## 2020-10-02 MED ORDER — MORPHINE SULFATE (PF) 2 MG/ML IV SOLN
2.0000 mg | Freq: Once | INTRAVENOUS | Status: AC
  Administered 2020-10-02: 2 mg via INTRAVENOUS
  Filled 2020-10-02: qty 1

## 2020-10-02 MED ORDER — AMOXICILLIN-POT CLAVULANATE 875-125 MG PO TABS
1.0000 | ORAL_TABLET | Freq: Once | ORAL | Status: AC
  Administered 2020-10-02: 1 via ORAL
  Filled 2020-10-02: qty 1

## 2020-10-02 MED ORDER — SODIUM CHLORIDE 0.9 % IV BOLUS
500.0000 mL | Freq: Once | INTRAVENOUS | Status: AC
  Administered 2020-10-02: 500 mL via INTRAVENOUS

## 2020-10-02 MED ORDER — OXYCODONE-ACETAMINOPHEN 5-325 MG PO TABS: 1.0000 | ORAL_TABLET | Freq: Four times a day (QID) | ORAL | 0 refills | Status: AC | PRN

## 2020-10-02 MED ORDER — AMOXICILLIN-POT CLAVULANATE 875-125 MG PO TABS: 1.0000 | ORAL_TABLET | Freq: Two times a day (BID) | ORAL | 0 refills | Status: AC

## 2020-10-02 MED ORDER — ONDANSETRON 4 MG PO TBDP: 4.0000 mg | ORAL_TABLET | Freq: Three times a day (TID) | ORAL | 0 refills | Status: AC | PRN

## 2020-10-02 NOTE — ED Triage Notes (Signed)
C/o lower abd pain x 2 days. Pt states that the pain comes in waves and then she has diarrhea. Pt c/o N/V even with eating BRAT diet. Hx of diverticulitis. Called GI and due to symptoms told to come to ER. Pt aaox3, ambualtory with steady gait, VSS, GCS 15, NAD noted.

## 2020-10-02 NOTE — Discharge Instructions (Signed)
We believe your symptoms are caused by diverticulitis.  Most of the time this condition (please read through the included information) can be cured with outpatient antibiotics.  Please take the full course of prescribed medication(s) and follow up with the doctors recommended above.  Return to the ED if your abdominal pain worsens or fails to improve, you develop bloody vomiting, bloody diarrhea, you are unable to tolerate fluids due to vomiting, fever greater than 101, or other symptoms that concern you.  Take Percocet as prescribed for severe pain. Do not drink alcohol, drive or participate in any other potentially dangerous activities while taking this medication as it may make you sleepy. Do not take this medication with any other sedating medications, either prescription or over-the-counter. If you were prescribed Percocet or Vicodin, do not take these with acetaminophen (Tylenol) as it is already contained within these medications.   This medication is an opiate (or narcotic) pain medication and can be habit forming.  Use it as little as possible to achieve adequate pain control.  Do not use or use it with extreme caution if you have a history of opiate abuse or dependence.  This medication is intended for your use only - do not give any to anyone else and keep it in a secure place where nobody else, especially children, have access to it.  It will also cause or worsen constipation, so you may want to consider taking an over-the-counter stool softener while you are taking this medication.

## 2020-10-10 NOTE — ED Provider Notes (Signed)
Emergency Department Provider Note   I have reviewed the triage vital signs and the nursing notes.   HISTORY  Chief Complaint Abdominal Pain   HPI Christy Wong is a 65 y.o. female presents to the ED with lower abdominal pain with diarrhea. She notes a clinical diagnosis last year of diverticulitis which improved with abx. She has similar pain now. She has tried OTC meds and diet change at home with no relief. She denies any fever/chills. Notes some associated nausea and vomiting without blood. Pain is moderate and mainly in the LLQ. No radiation of symptoms.   Past Medical History:  Diagnosis Date  . Chest pain   . Family history of heart disease   . HTN (hypertension)   . Hyperlipidemia   . Obesity   . Vaginal delivery 1979, 1982    Patient Active Problem List   Diagnosis Date Noted  . Chest pain 07/04/2014  . Essential hypertension 07/04/2014  . Hyperlipidemia 07/04/2014  . Polyp, vagina 05/21/2014  . Menopausal symptoms 12/07/2011    Past Surgical History:  Procedure Laterality Date  . DILATATION & CURETTAGE/HYSTEROSCOPY WITH MYOSURE N/A 07/30/2015   Procedure: DILATATION & CURETTAGE/HYSTEROSCOPY WITH MYOSURE;  Surgeon: Thurnell Lose, MD;  Location: [facility];  Service: Gynecology;  Laterality: N/A;  . tummy tuck    . WISDOM TOOTH EXTRACTION      Allergies Bee venom  Family History  Problem Relation Age of Onset  . Heart disease Father   . Uterine cancer Daughter   . Hypertension Mother     Social History Social History   Tobacco Use  . Smoking status: Former Smoker    Types: Cigarettes  . Tobacco comment: smoked for a short period in the 70s and also used the nicotine vapor in 2014  Substance Use Topics  . Alcohol use: Yes    Alcohol/week: 0.0 standard drinks    Comment: occasionally  . Drug use: No    Review of Systems  Constitutional: No fever/chills Eyes: No visual changes. ENT: No sore throat. Cardiovascular: Denies chest  pain. Respiratory: Denies shortness of breath. Gastrointestinal: Positive LLQ abdominal pain. Positive nausea and vomiting.  No diarrhea.  No constipation. Genitourinary: Negative for dysuria. Musculoskeletal: Negative for back pain. Skin: Negative for rash. Neurological: Negative for headaches, focal weakness or numbness.  10-point ROS otherwise negative.  ____________________________________________   PHYSICAL EXAM:  VITAL SIGNS: ED Triage Vitals  Enc Vitals Group     BP 10/02/20 1928 (!) 142/90     Pulse Rate 10/02/20 1928 90     Resp 10/02/20 1928 19     Temp 10/02/20 1928 100 F (37.8 C)     Temp Source 10/02/20 1928 Oral     SpO2 10/02/20 1928 98 %     Weight 10/02/20 1930 155 lb (70.3 kg)     Height 10/02/20 1930 5\' 2"  (1.575 m)   Constitutional: Alert and oriented. Well appearing and in no acute distress. Eyes: Conjunctivae are normal.  Head: Atraumatic. Nose: No congestion/rhinnorhea. Mouth/Throat: Mucous membranes are moist. Cardiovascular: Normal rate, regular rhythm. Good peripheral circulation. Grossly normal heart sounds.   Respiratory: Normal respiratory effort.  No retractions. Lungs CTAB. Gastrointestinal: Soft with mild LLQ tenderness. No rebound or guarding. No distention.  Musculoskeletal: No lower extremity tenderness nor edema. No gross deformities of extremities. Neurologic:  Normal speech and language. No gross focal neurologic deficits are appreciated.  Skin:  Skin is warm, dry and intact. No rash noted.  ____________________________________________  LABS (all labs ordered are listed, but only abnormal results are displayed)  Labs Reviewed  COMPREHENSIVE METABOLIC PANEL - Abnormal; Notable for the following components:      Result Value   Chloride 97 (*)    Glucose, Bld 109 (*)    Total Protein 8.2 (*)    All other components within normal limits  CBC - Abnormal; Notable for the following components:   WBC 14.8 (*)    All other  components within normal limits  URINALYSIS, ROUTINE W REFLEX MICROSCOPIC - Abnormal; Notable for the following components:   Hgb urine dipstick TRACE (*)    Ketones, ur 15 (*)    All other components within normal limits  LIPASE, BLOOD  URINALYSIS, MICROSCOPIC (REFLEX)   _____________________________________ ____________________________________________  RADIOLOGY  CT imaging reviewed. Uncomplicated diverticulitis noted.  ____________________________________________   PROCEDURES  Procedure(s) performed:   Procedures  None ____________________________________________   INITIAL IMPRESSION / ASSESSMENT AND PLAN / ED COURSE  Pertinent labs & imaging results that were available during my care of the patient were reviewed by me and considered in my medical decision making (see chart for details).   Patient presents to the ED with LLQ tenderness. Considered diverticulitis, ureteral stone, and aortic pathology. CT imaging shows acute diverticulitis without complication. Labs reviewed. No sepsis. Patient feeling improved with meds here. Able to tolerate PO. Plan for augmentin with close PCP and GI follow up.   At this time, I do not feel there is any life-threatening condition present. I have reviewed and discussed all results (EKG, imaging, lab, urine as appropriate), exam findings with patient. I have reviewed nursing notes and appropriate previous records.  I feel the patient is safe to be discharged home without further emergent workup. Discussed usual and customary return precautions. Patient and family (if present) verbalize understanding and are comfortable with this plan.  Patient will follow-up with their primary care provider. If they do not have a primary care provider, information for follow-up has been provided to them. All questions have been answered.  ____________________________________________  FINAL CLINICAL IMPRESSION(S) / ED DIAGNOSES  Final diagnoses:   Diverticulitis     MEDICATIONS GIVEN DURING THIS VISIT:  Medications  sodium chloride 0.9 % bolus 500 mL (0 mLs Intravenous Stopped 10/02/20 2340)  morphine 2 MG/ML injection 2 mg (2 mg Intravenous Given 10/02/20 2242)  ondansetron (ZOFRAN) injection 4 mg (4 mg Intravenous Given 10/02/20 2238)  iohexol (OMNIPAQUE) 300 MG/ML solution 100 mL (100 mLs Intravenous Contrast Given 10/02/20 2223)  amoxicillin-clavulanate (AUGMENTIN) 875-125 MG per tablet 1 tablet (1 tablet Oral Given 10/02/20 2340)     NEW OUTPATIENT MEDICATIONS STARTED DURING THIS VISIT:  Discharge Medication List as of 10/02/2020 11:26 PM    START taking these medications   Details  amoxicillin-clavulanate (AUGMENTIN) 875-125 MG tablet Take 1 tablet by mouth every 12 (twelve) hours for 7 days., Starting Wed 10/02/2020, Until Wed 10/09/2020, Normal    ondansetron (ZOFRAN ODT) 4 MG disintegrating tablet Take 1 tablet (4 mg total) by mouth every 8 (eight) hours as needed., Starting Wed 10/02/2020, Normal        Note:  This document was prepared using Dragon voice recognition software and may include unintentional dictation errors.  Nanda Quinton, MD, [facility]    Avryl Roehm, [facility], MD 10/10/20 [phone]

## 2021-12-02 ENCOUNTER — Other Ambulatory Visit: Payer: Self-pay | Admitting: Specialist

## 2021-12-02 DIAGNOSIS — M7121 Synovial cyst of popliteal space [Baker], right knee: Secondary | ICD-10-CM

## 2021-12-05 ENCOUNTER — Ambulatory Visit
Admission: RE | Admit: 2021-12-05 | Discharge: 2021-12-05 | Disposition: A | Discharge status: Home / Self Care | Payer: Medicare Other | Source: Ambulatory Visit | Attending: Specialist | Admitting: Specialist

## 2021-12-05 DIAGNOSIS — M7121 Synovial cyst of popliteal space [Baker], right knee: Secondary | ICD-10-CM

## 2022-07-08 ENCOUNTER — Other Ambulatory Visit: Payer: Self-pay | Admitting: Specialist

## 2022-07-08 DIAGNOSIS — M7121 Synovial cyst of popliteal space [Baker], right knee: Secondary | ICD-10-CM

## 2022-07-22 ENCOUNTER — Other Ambulatory Visit: Payer: Self-pay | Admitting: Specialist

## 2022-07-22 ENCOUNTER — Ambulatory Visit
Admission: RE | Admit: 2022-07-22 | Discharge: 2022-07-22 | Disposition: A | Discharge status: Home / Self Care | Payer: PRIVATE HEALTH INSURANCE | Source: Ambulatory Visit | Attending: Specialist | Admitting: Specialist

## 2022-07-22 DIAGNOSIS — M7121 Synovial cyst of popliteal space [Baker], right knee: Secondary | ICD-10-CM

## 2023-06-17 ENCOUNTER — Encounter (HOSPITAL_COMMUNITY): Payer: Self-pay | Admitting: Nurse Practitioner

## 2023-06-17 ENCOUNTER — Other Ambulatory Visit: Payer: Self-pay | Admitting: Nurse Practitioner

## 2023-06-17 DIAGNOSIS — I700 Atherosclerosis of aorta: Secondary | ICD-10-CM

## 2023-07-15 IMAGING — US US DRAIN/INJ INTER JOINT/BURSA
1 series · 13 of 25 positions shown · non-contrast
Comparison: none

INDICATION: 65-year-old female with history of recurrent right Baker cyst status
post multiple prior aspirations in steroid injections. Patient
receives periodically right knee gel injections.

[Series 1: us drain/inj inter joint/bursa · 0.08mm/px · 27 acquisitions, 13 frames shown]
[im 1/27]
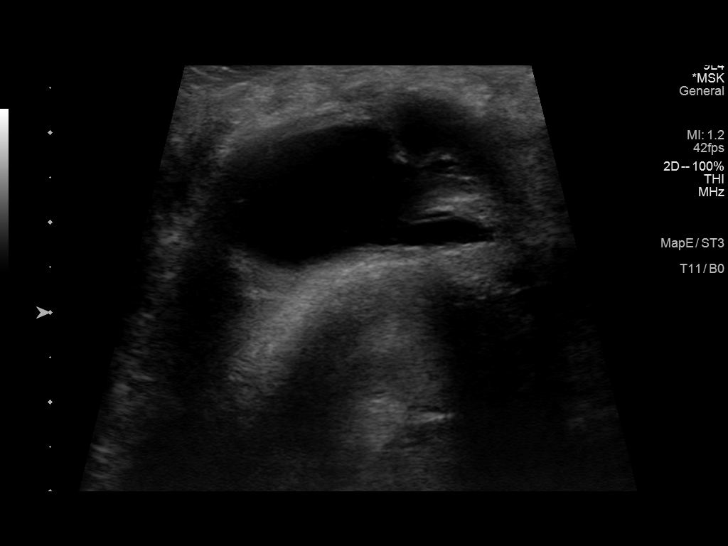
[im 3/27]
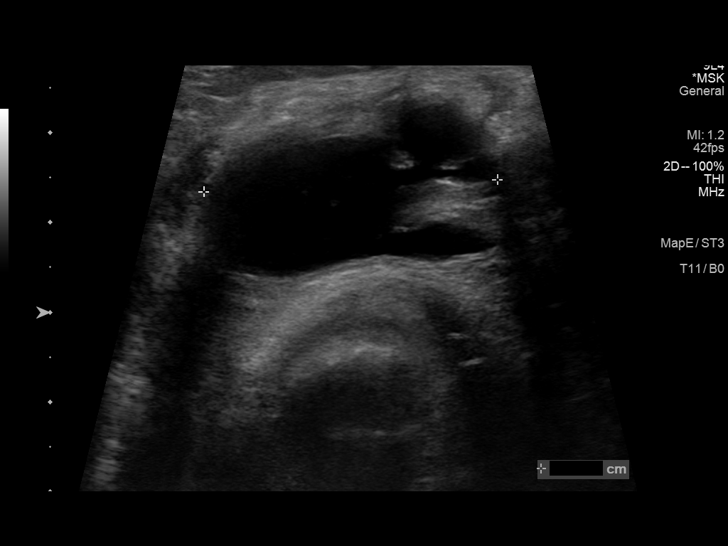
[im 5/27]
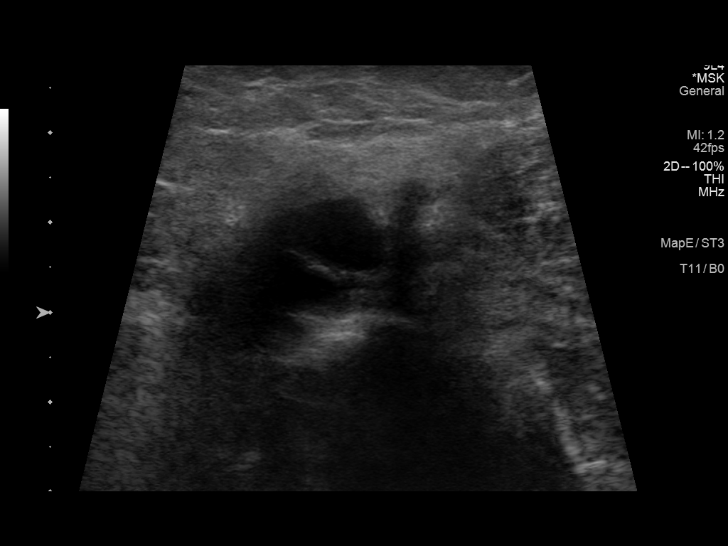
[im 7/27]
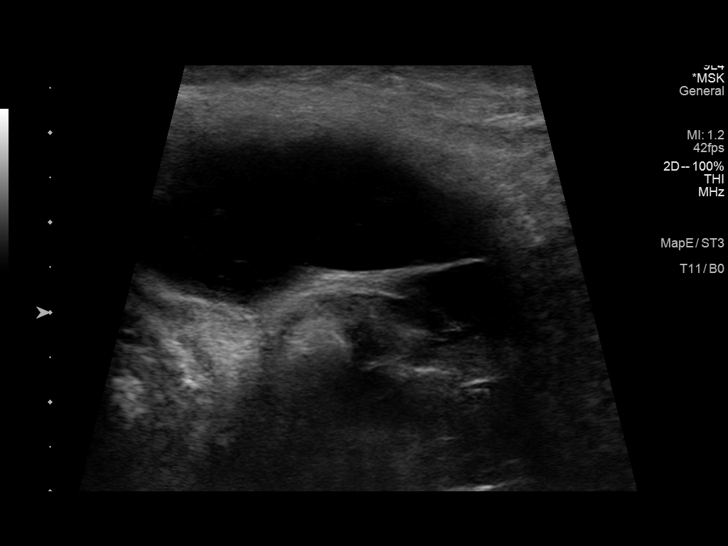
[im 9/27]
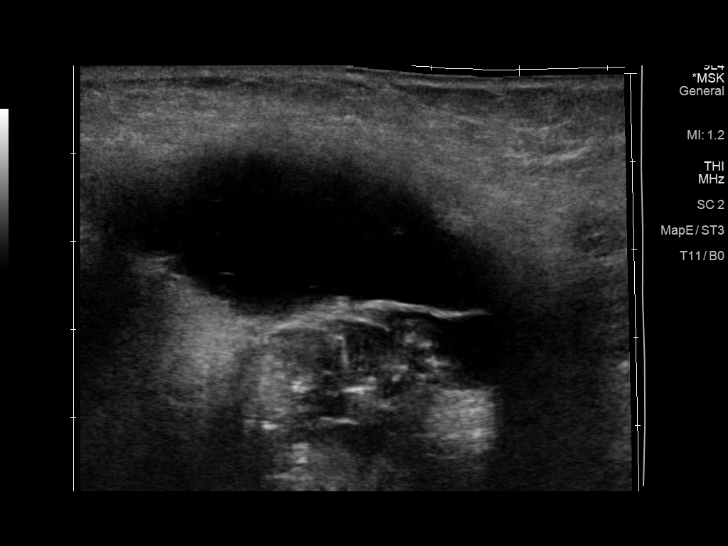
[im 11/27]
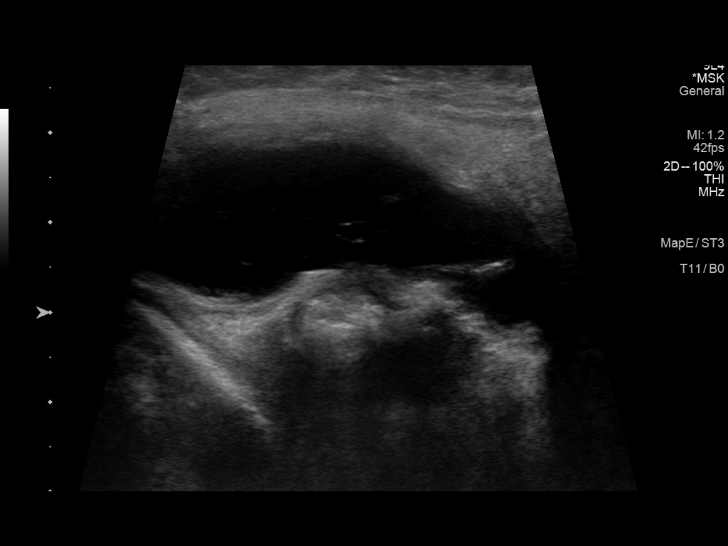
[im 14/27]
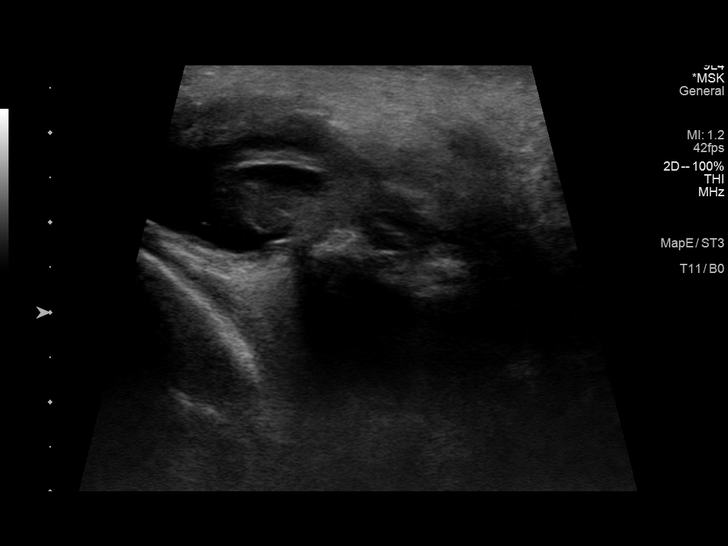
[im 16/27]
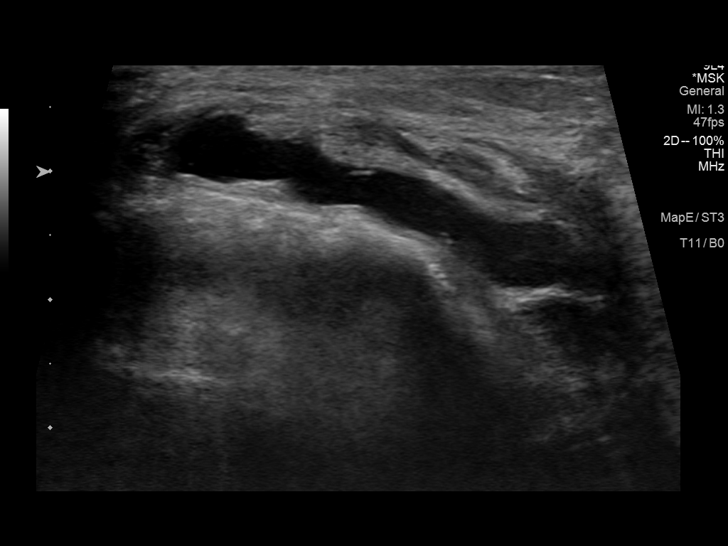
[im 18/27]
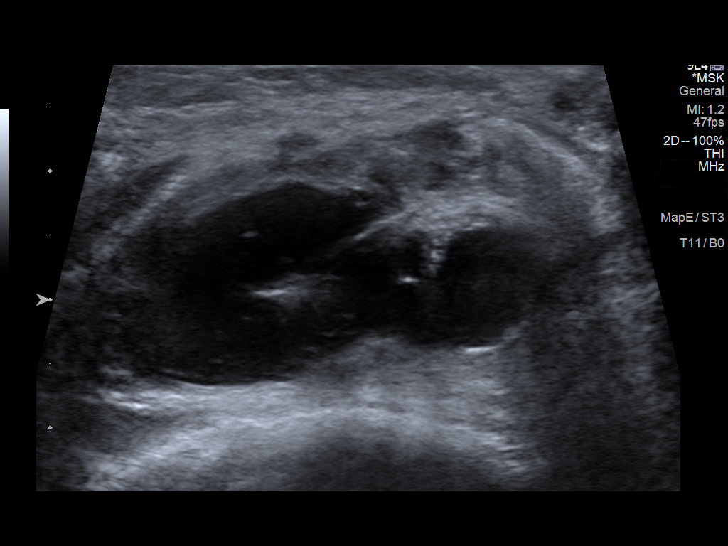
[im 20/27]
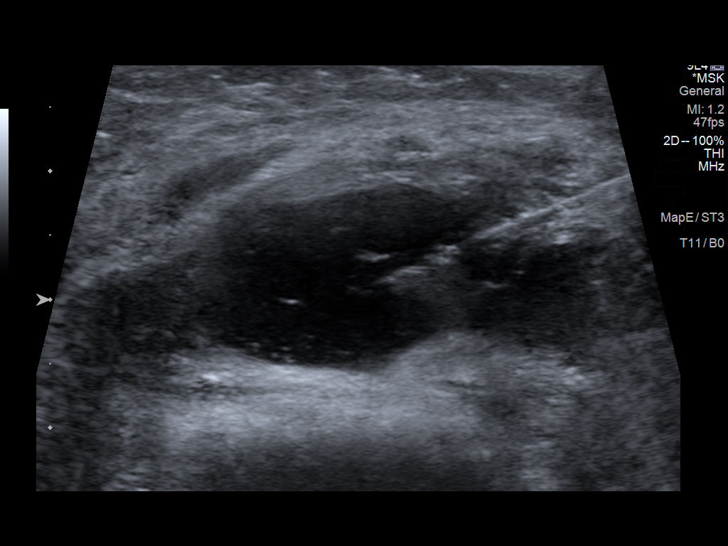
[im 22/27]
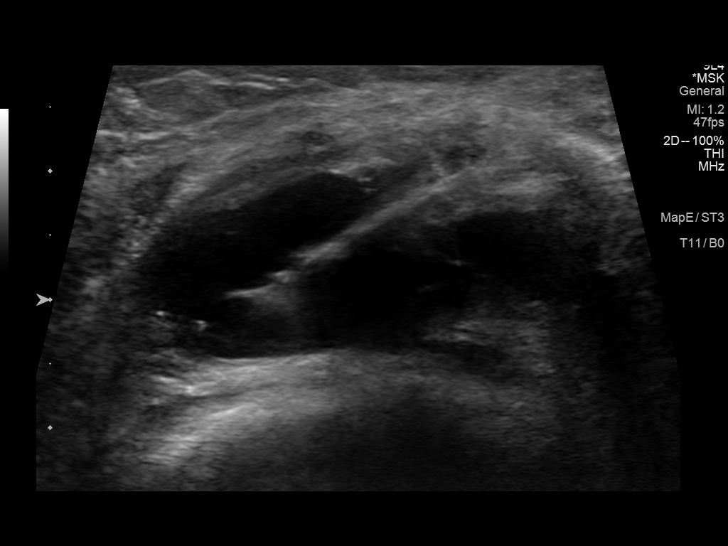
[im 24/27]
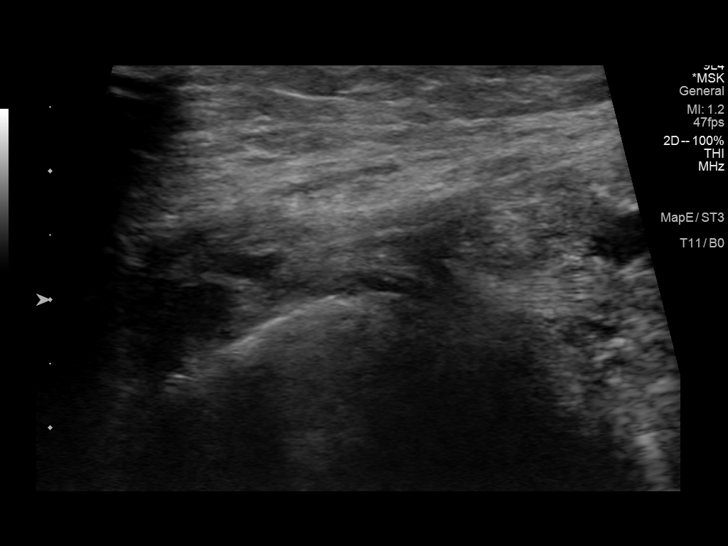
[im 27/27]
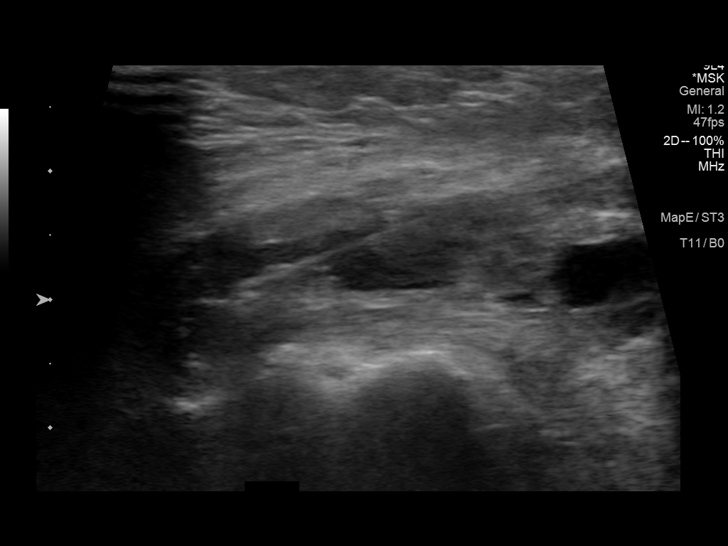

[13 of 25 positions shown; findings below may reference images not displayed]

EXAM:
Ultrasound-guided Baker's cyst aspiration and injection

MEDICATIONS:
80 mg Depo-Medrol, 5 mL 1% lidocaine, intra-articular.

ANESTHESIA/SEDATION:
None.

COMPLICATIONS:
None immediate.

PROCEDURE:
Informed written consent was obtained from the patient after a
thorough discussion of the procedural risks, benefits and
alternatives. All questions were addressed. Maximal Sterile Barrier
Technique was utilized including caps, mask, sterile gowns, sterile
gloves, sterile drape, hand hygiene and skin antiseptic. A timeout
was performed prior to the initiation of the procedure.

Preprocedure ultrasound evaluation of the right knee demonstrated a
mostly simple appearing Baker's cyst in the expected location of the
popliteal fossa. Additionally, there was a smaller fluid collection
along the medial joint line, which corresponds with the patient's
area of discomfort and swelling.

The right posterior knee was prepped and draped in standard fashion.
Subdermal Local anesthesia was provided at planned needle entry
site. With ultrasound visualization, deeper local anesthetic was
administered along the planned needle entry site to the periphery of
the Baker cyst. Under direct ultrasound visualization, an 18 gauge,
5 cm trocar needle was directed into the Baker cyst. Upon
aspiration, there was trace return highly viscous, straw-colored
fluid. Given limited aspirate, the 18 gauge trocar was exchanged for
a 5 French, 7 cm Yueh needle. Additional aspirate was performed with
a greater yield, partially bloody. Near entirety of the viscous
fluid was removed from the Baker cyst, totaling approximately 5 mL.
Next, total of 80 mg Depo-Medrol and 2 mL 1% lidocaine were
injected. The Yueh catheter was then removed.

The medial aspect of the joint was then reexamined and was
unchanged.

Sterile bandage was applied. The patient tolerated well without
immediate complication.
IMPRESSION: Technically successful ultrasound-guided right Baker cyst aspiration
and injection. Aspirate was much thicker than synovial fluid and
suspected to be migrated gel from prior knee joint injection.

## 2023-07-30 ENCOUNTER — Other Ambulatory Visit (HOSPITAL_COMMUNITY): Payer: Self-pay | Admitting: Nurse Practitioner

## 2023-07-30 DIAGNOSIS — I700 Atherosclerosis of aorta: Secondary | ICD-10-CM

## 2023-08-03 ENCOUNTER — Ambulatory Visit (HOSPITAL_COMMUNITY): Payer: Medicare Other

## 2023-08-06 ENCOUNTER — Ambulatory Visit (HOSPITAL_COMMUNITY)
Admission: RE | Admit: 2023-08-06 | Discharge: 2023-08-06 | Disposition: A | Discharge status: Home / Self Care | Payer: Self-pay | Source: Ambulatory Visit | Attending: Nurse Practitioner | Admitting: Nurse Practitioner

## 2023-08-06 DIAGNOSIS — I700 Atherosclerosis of aorta: Secondary | ICD-10-CM | POA: Insufficient documentation
